# Patient Record
Sex: Male | Born: 1995 | Race: Black or African American | Hispanic: No | Marital: Single
Health system: Southern US, Community
[De-identification: ages and names within clinical notes are randomized; demographics above are authoritative.]

## PROBLEM LIST (undated history)

## (undated) DIAGNOSIS — L309 Dermatitis, unspecified: Secondary | ICD-10-CM

## (undated) HISTORY — DX: Dermatitis, unspecified: L30.9

---

## 2020-09-17 ENCOUNTER — Encounter (HOSPITAL_COMMUNITY): Payer: Self-pay

## 2020-09-17 ENCOUNTER — Emergency Department (HOSPITAL_COMMUNITY): Payer: Self-pay

## 2020-09-17 ENCOUNTER — Emergency Department (HOSPITAL_COMMUNITY)
Admission: EM | Admit: 2020-09-17 | Discharge: 2020-09-17 | Disposition: A | Discharge status: Home / Self Care | Payer: Self-pay | Attending: Emergency Medicine | Admitting: Emergency Medicine

## 2020-09-17 DIAGNOSIS — S0990XA Unspecified injury of head, initial encounter: Secondary | ICD-10-CM | POA: Insufficient documentation

## 2020-09-17 DIAGNOSIS — W2101XA Struck by football, initial encounter: Secondary | ICD-10-CM | POA: Insufficient documentation

## 2020-09-17 DIAGNOSIS — Y9362 Activity, american flag or touch football: Secondary | ICD-10-CM | POA: Insufficient documentation

## 2020-09-17 NOTE — ED Triage Notes (Signed)
Pt was playing flag football and collided with someone, hit head LOC, hematoma to back of head, bleeding controlled, denies other injuries.

## 2020-09-17 NOTE — ED Notes (Signed)
Transport to CT

## 2020-09-17 NOTE — Discharge Instructions (Addendum)
You were seen in the emergency department today following a head injury.  We suspect that you have a concussion, otherwise known and as a mild traumatic brain injury.  Your  CT scan did not show any findings requiring you to be admitted to the hospital.  1. Medications: Ibuprofen or Tylenol for pain 2. Treatment: Rest, ice on head.  Concussion precautions given - keep patient in a quiet, not simulating, dark environment. No TV, computer use, video games until headache is resolved completely. No contact sports until cleared by the primary care provider. 3. Follow Up: With primary care physician in 2-3 days if headache persists.  Return to the emergency department if patient becomes lethargic, begins vomiting , develops double vision, speech difficulty, problems walking or other change in mental status.  We would like you to follow-up with the [facility], contact information below:  Address: [address], [place], [place] [address] Phone: [phone]  Per [facility] Website:   What to Expect: Evaluations at the Concussion Clinic All patients at the Concussion Clinic are given an extensive three-part evaluation that includes: a computerized test to measure memory, visual processing speed, and reaction time a test that measures the systems that integrate movement, balance, and vision an in-depth review of a detailed symptoms checklist for signs of concussion The evaluation process is critical for the treatment and recovery of a concussed patient as no two concussions are alike. Thankfully, the diagnostic tools that trained professionals use can help to better manage head injuries.  Part of the technology the doctors and staff at [facility] use in their assessments is a computerized examination called ImPACT. This tool uses six tasks to measure memory, visual processing speed, and reaction time. By analyzing the results of the  examination and comparing them to average responses or a baseline score for a patient, our staff can make an informed judgment about the patient's cognitive functions. In addition to the ImPACT examination, patients are given a Vestibular Ocular Motor Screening test. This is a simple and painless test that focuses on the systems that integrate a patient's movement, balance, and vision.  These tests are used in conjunction with a thorough review of a detailed symptoms checklist to complete the patient's evaluation and develop a treatment plan.  You do not need a referral, and you can book an appointment online. Our Concussion Hotline is staffed by trained professionals during our regular office hours: Monday - Thursday from 7:30 AM to 4:30 PM, and Fridays from 7:30 AM to 12:00 PM, and the number to call is [phone]. The Concussion Clinic team meets with patients at our [facility] office. Call today.   Further ED Instructions:   Please call and follow-up within the concussion clinic as well as your primary care provider within the next 3 to 5 days.  In the meantime we would like you to avoid strenuous/over exertional activities such as sports or running.  Please avoid excess screen time utilizing cell phones, computers, or the TV.  Please avoid activities that require significant amount of concentration.  Please try to rest as much as possible.  Please take Tylenol and/or Motrin per over-the-counter dosing instructions for any continued discomfort.  Return to the ER for new or worsening symptoms or any other concerns that you may have.

## 2020-09-17 NOTE — ED Provider Notes (Signed)
[facility] EMERGENCY DEPARTMENT Provider Note   CSN: [id number] Arrival date & time: 09/17/20  2019     History Chief Complaint  Patient presents with  . Head Laceration    Douglas Norman is a 24 y.o. male with past medical history significant for eczema. Tetanus immunization is up to date.  HPI Patient presents to emergency department today with chief complaint of head laceration happening just prior to arrival.  Patient does not remember exactly what happened.  The last thing he remembers is he was playing flag football and he jumped to catch a ball. He woke up on the ground.  Patient friend is at the bedside.  He is actually the person that patient collided with.  Friend states that both jumped to knock a ball out of the air and collided.  Patient fell backwards and hit his head on the pavement.  Patient did not lose consciousness however was confused.  He was moving all extremities after the fall.  When he stood up to walk he was dizzy.  Patient is acting like his usual self per friend.  Patient is endorsing pain localized to wound on his head.  He rates the pain 2 out of 10 in severity.  He describes as a dull aching sensation.  No radiation of pain.  Bleeding is controlled with pressure.  He denies blurry vision, neck pain, numbness, tingling, weakness, nausea, emesis.    Past Medical History:  Diagnosis Date  . Eczema     There are no problems to display for this patient.   History reviewed. No pertinent surgical history.     No family history on file.  Social History   Tobacco Use  . Smoking status: Not on file  Substance Use Topics  . Alcohol use: Not on file  . Drug use: Not on file    Home Medications Prior to Admission medications   Not on File    Allergies    Patient has no known allergies.  Review of Systems   Review of Systems All other systems are reviewed and are negative for acute change except as noted in the HPI.  Physical  Exam Updated Vital Signs BP 124/60 (BP Location: Right Arm)   Pulse 71   Temp 98.5 F (36.9 C) (Oral)   Resp 16   Ht 5\' 8"  (1.727 m)   Wt 79.8 kg   SpO2 98%   BMI 26.76 kg/m   Physical Exam Vitals and nursing note reviewed.  Constitutional:      Appearance: He is well-developed. He is not ill-appearing or toxic-appearing.  HENT:     Head: Normocephalic. Laceration present. No raccoon eyes or Battle's sign.     Jaw: There is normal jaw occlusion.      Right Ear: No mastoid tenderness. No hemotympanum.     Left Ear: No mastoid tenderness. No hemotympanum.     Nose: Nose normal.     Right Nostril: No epistaxis.     Left Nostril: No epistaxis.     Mouth/Throat:     Mouth: No injury or lacerations.     Pharynx: Oropharynx is clear. Uvula midline.  Eyes:     General: No scleral icterus.       Right eye: No discharge.        Left eye: No discharge.     Conjunctiva/sclera: Conjunctivae normal.     Comments: No pain with EOMs  Neck:     Vascular: No JVD.  Comments: Full ROM intact without spinous process TTP. No bony stepoffs or deformities, no paraspinous muscle TTP or muscle spasms. No rigidity or meningeal signs. No bruising, erythema, or swelling.  Cardiovascular:     Rate and Rhythm: Normal rate and regular rhythm.     Pulses: Normal pulses.     Heart sounds: Normal heart sounds.  Pulmonary:     Effort: Pulmonary effort is normal.     Breath sounds: Normal breath sounds.  Abdominal:     General: There is no distension.  Musculoskeletal:        General: Normal range of motion.     Cervical back: Normal range of motion.  Skin:    General: Skin is warm and dry.  Neurological:     Mental Status: He is oriented to person, place, and time.     GCS: GCS eye subscore is 4. GCS verbal subscore is 5. GCS motor subscore is 6.     Comments: Fluent speech, no facial droop.  Speech is clear and goal oriented, follows commands CN III-XII intact, no facial droop Normal  strength in upper and lower extremities bilaterally including dorsiflexion and plantar flexion, strong and equal grip strength Sensation normal to light and sharp touch Moves extremities without ataxia, coordination intact Normal finger to nose and rapid alternating movements Normal gait and balance   Psychiatric:        Behavior: Behavior normal.     ED Results / Procedures / Treatments   Labs (all labs ordered are listed, but only abnormal results are displayed) Labs Reviewed - No data to display  EKG None  Radiology CT Head Wo Contrast  Result Date: 09/17/2020 CLINICAL DATA:  Bumped heads with brother while playing football, short loss of consciousness EXAM: CT HEAD WITHOUT CONTRAST TECHNIQUE: Contiguous axial images were obtained from the base of the skull through the vertex without intravenous contrast. COMPARISON:  None. FINDINGS: Brain: No evidence of acute infarction, hemorrhage, hydrocephalus, extra-axial collection, visible mass lesion or mass effect. Vascular: No hyperdense vessel or unexpected calcification. Skull: Left parietal scalp swelling and crescentic hematoma measuring up to 12 mm in maximal thickness. No subjacent calvarial fracture or acute osseous injury. No suspicious osseous lesions. Sinuses/Orbits: Paranasal sinuses and mastoid air cells are predominantly clear. Included orbital structures are unremarkable. Other: None IMPRESSION: 1. Left parietal scalp swelling and crescentic hematoma measuring up to 12 mm in maximal thickness. No subjacent calvarial fracture or acute osseous injury. 2. No acute intracranial abnormality. Electronically Signed   By: Kreg Shropshire M.D.   On: 09/17/2020 22:02    Procedures Procedures (including critical care time)  Medications Ordered in ED Medications - No data to display  ED Course  I have reviewed the triage vital signs and the nursing notes.  Pertinent labs & imaging results that were available during my care of the  patient were reviewed by me and considered in my medical decision making (see chart for details).    MDM Rules/Calculators/A&P                          History provided by patient and friend with additional history obtained from chart review.    Patient with head injury which did not cause of loss of consciousness but with persistent headache since the initial trauma.  No evidence of skull fracture on physical exam. Patient is not taking anticoagulants, is less than 65 and has no history of subarachnoid or subdural hemorrhage.  Patient denies nausea, vomiting, amnesia, vision changes,cognitive or memory dysfunction and vertigo.  Patient with no focal neurological deficits on physical exam.  Discussed thoroughly symptoms to return to the emergency department including severe headaches, disequilibrium, vomiting, double vision, extremity weakness, difficulty ambulating, or any other concerning symptoms.  Discussed the likely etiology of patient's symptoms being concussive in nature. CT head ordered based on hematoma and severity of injury with landing on pavement. CT shows no trauma on left parietal scalp measuring 12 mm in maximal thickness.  No adjacent fracture.  No acute intracranial abnormalities. Wound care performed.  Patient's wounds are superficial and there is nothing amenable to repair. Tetanus is UTD. Offered patient Tylenol for headache which he politely declined. Patient will be discharged with information pertaining to diagnosis and advised to use over-the-counter medications like NSAIDs and Tylenol for pain relief. Pt has also advised to not participate in contact sports until they are completely asymptomatic for at least 1 week or they are cleared by their doctor. Information given for concussion clinic for f/u.   Portions of this note were generated with Scientist, clinical (histocompatibility and immunogenetics). Dictation errors may occur despite best attempts at proofreading.      Final Clinical Impression(s) / ED  Diagnoses Final diagnoses:  Injury of head, initial encounter    Rx / DC Orders ED Discharge Orders    None       Kathyrn Lass 09/17/20 2300    Pollyann Savoy, MD 09/18/20 [id number]

## 2021-03-16 IMAGING — CT CT HEAD W/O CM
4 series · 15 of 47 positions shown, 17 images · non-contrast
Comparison: None.

CLINICAL DATA: Bumped heads with brother while playing football,
short loss of consciousness

EXAM:
CT HEAD WITHOUT CONTRAST
TECHNIQUE: Contiguous axial images were obtained from the base of the skull
through the vertex without intravenous contrast.

[Series 3: head without · axial · non-contrast · 0.46mm/px · z∈[+1555,+1690]mm · 7 of 37 slices shown, 9 images]
[im 5/37  brain]
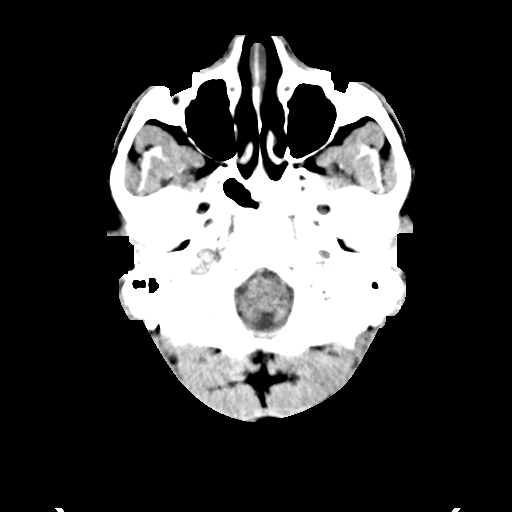
[im 5/37  bone]
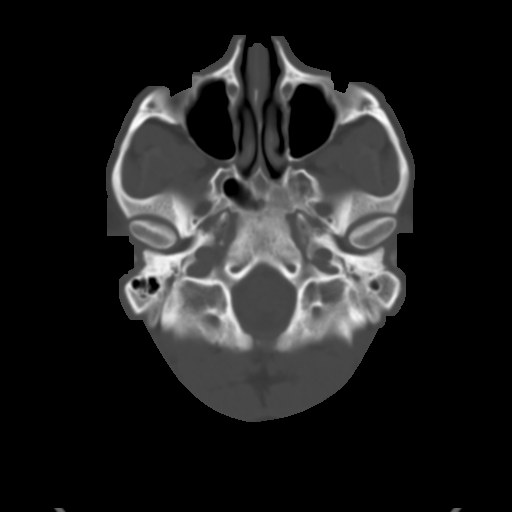
[im 10/37  brain]
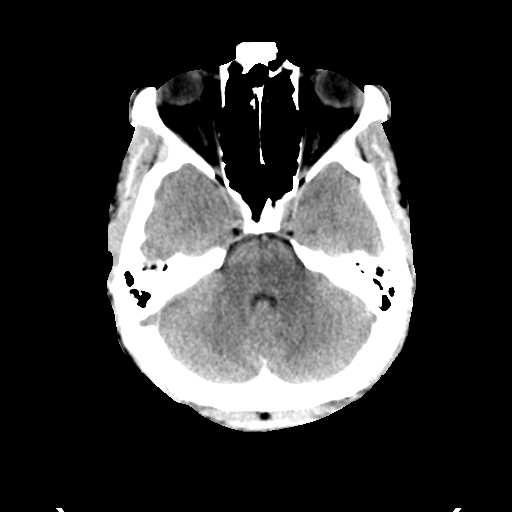
[im 14/37  brain]
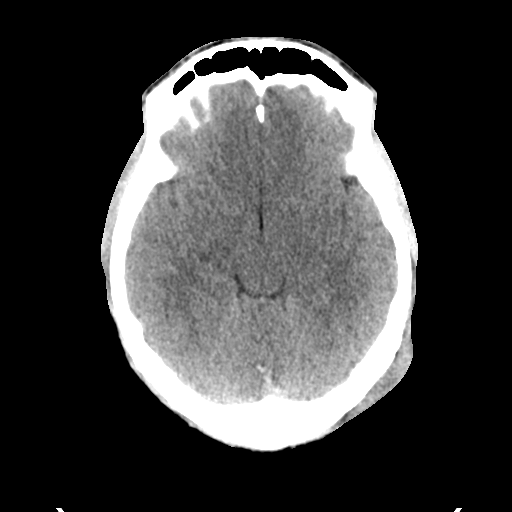
[im 19/37  brain]
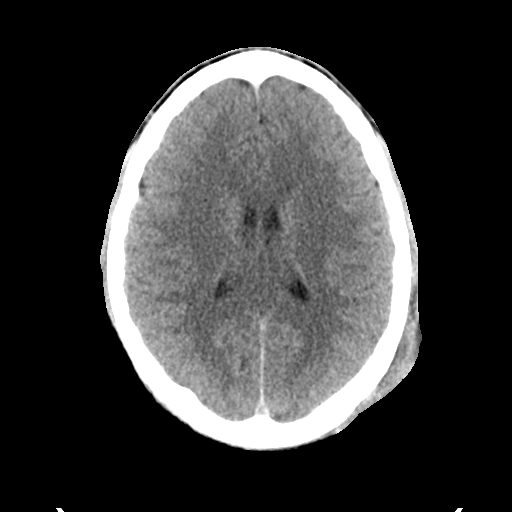
[im 23/37  brain]
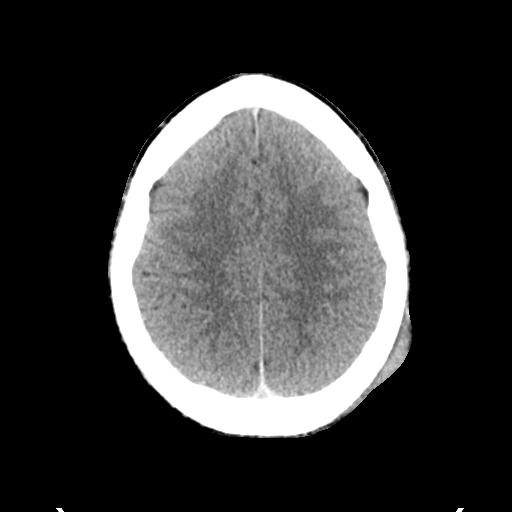
[im 23/37  bone]
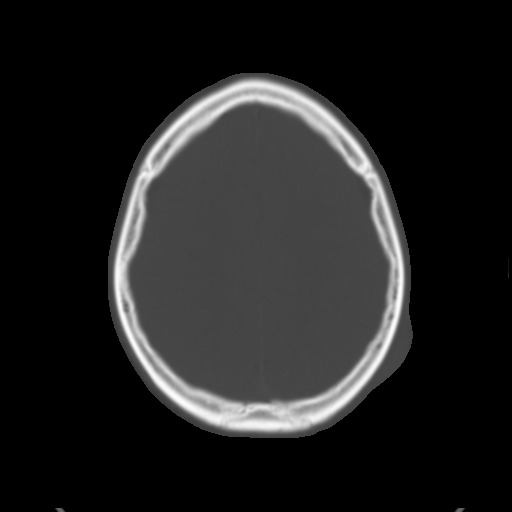
[im 28/37  brain]
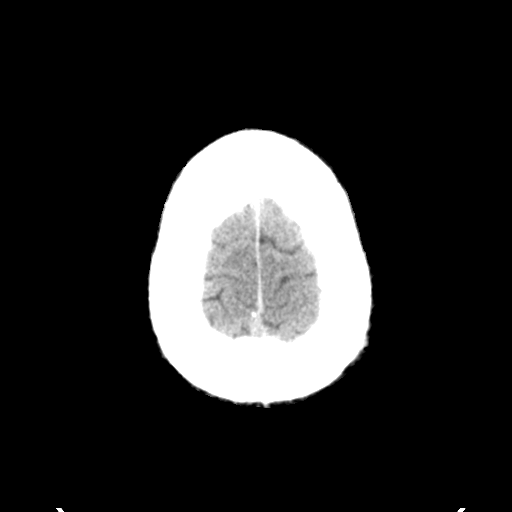
[im 32/37  brain]
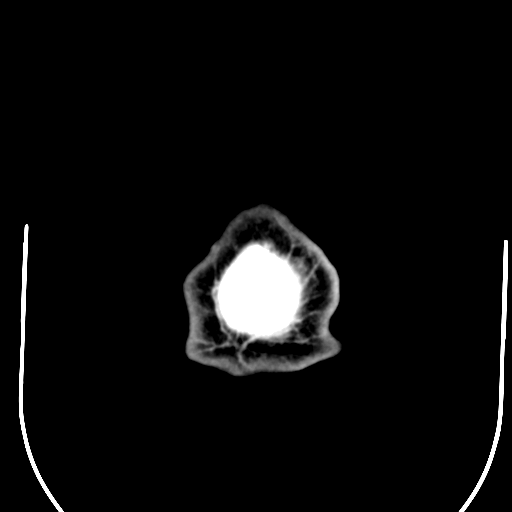

[Series 4: head bone · axial · 0.46mm/px · z∈[+1553,+1571]mm · 2 of 92 slices shown]
[im 10/92  bone]
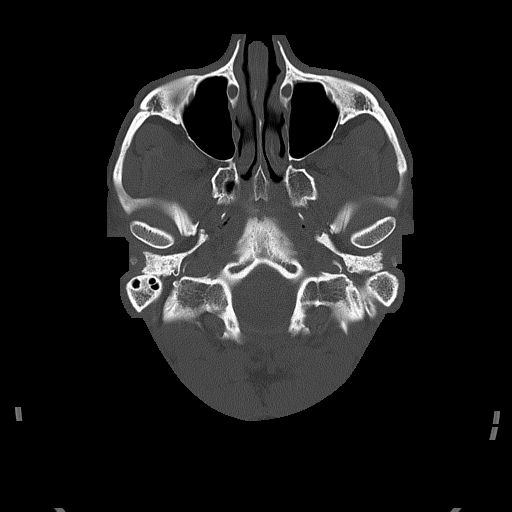
[im 19/92  bone]
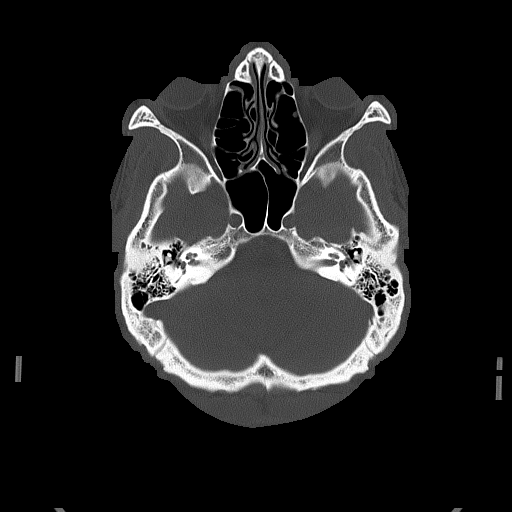

[Series 5: head without cor · coronal · non-contrast · 0.36mm/px · 3 of 67 slices shown]
[im 23/67  brain]
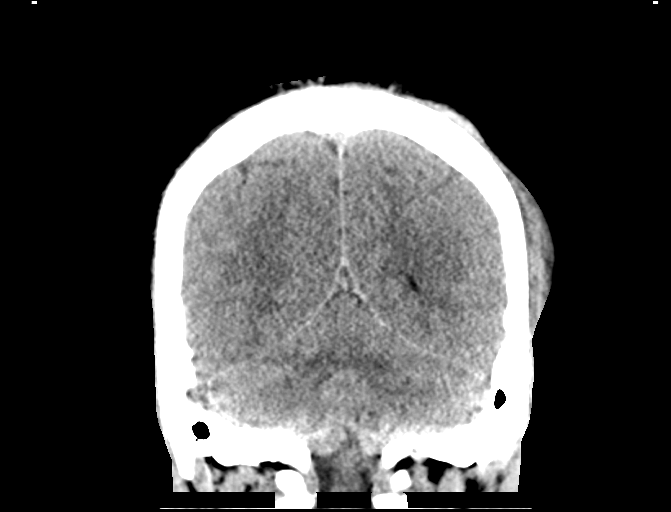
[im 30/67  brain]
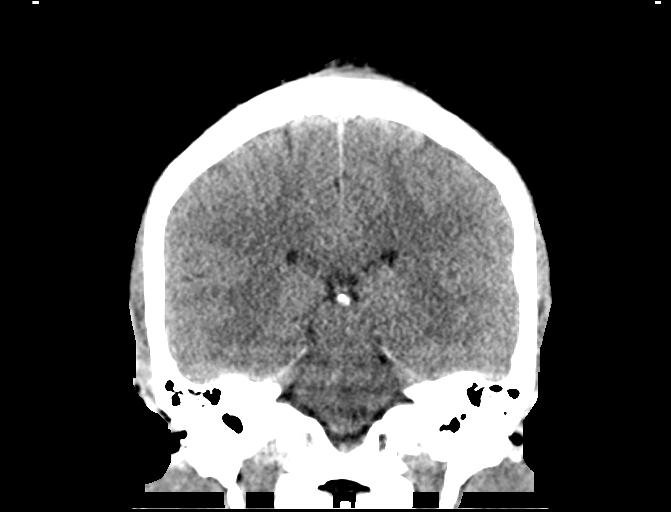
[im 37/67  brain]
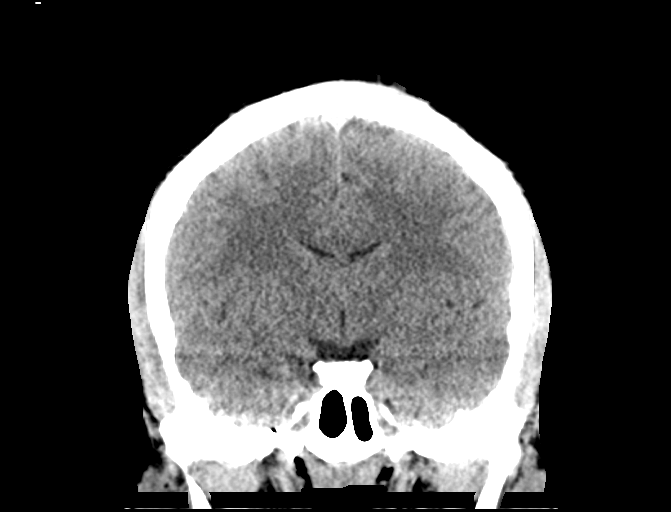

[Series 6: head without sag · sagittal · non-contrast · 0.36mm/px · 3 of 67 slices shown]
[im 23/67  brain]
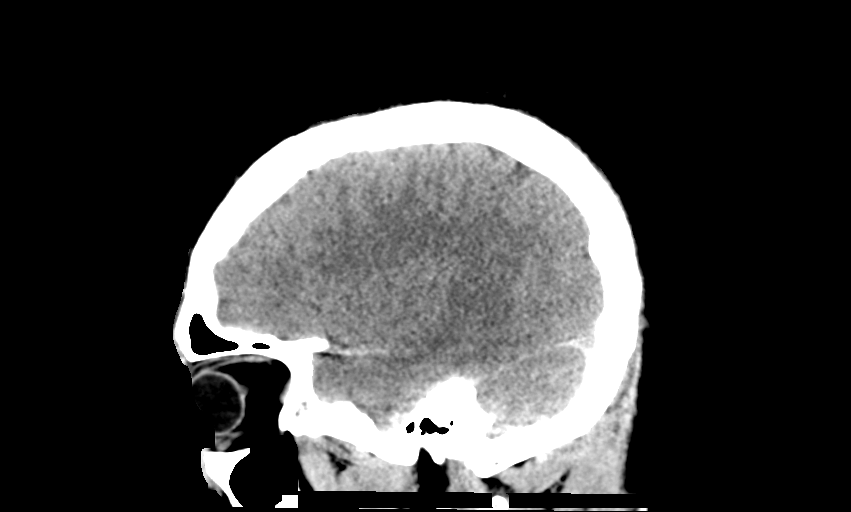
[im 34/67  brain]
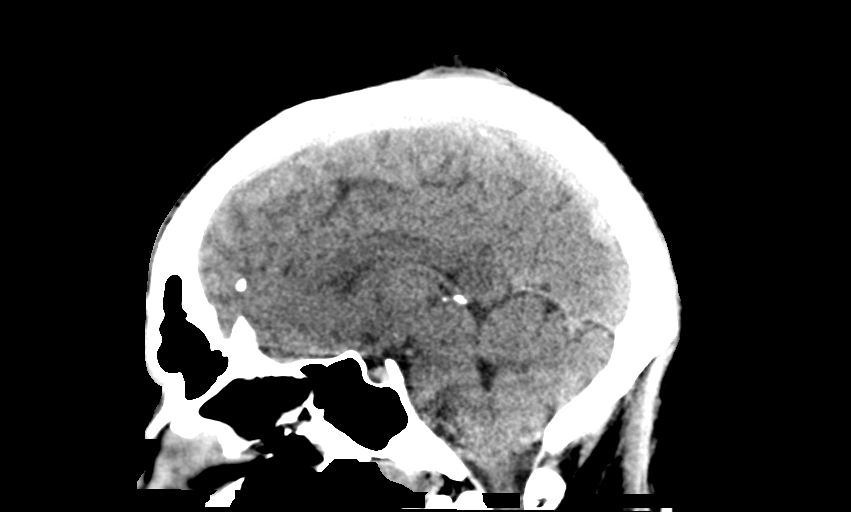
[im 45/67  brain]
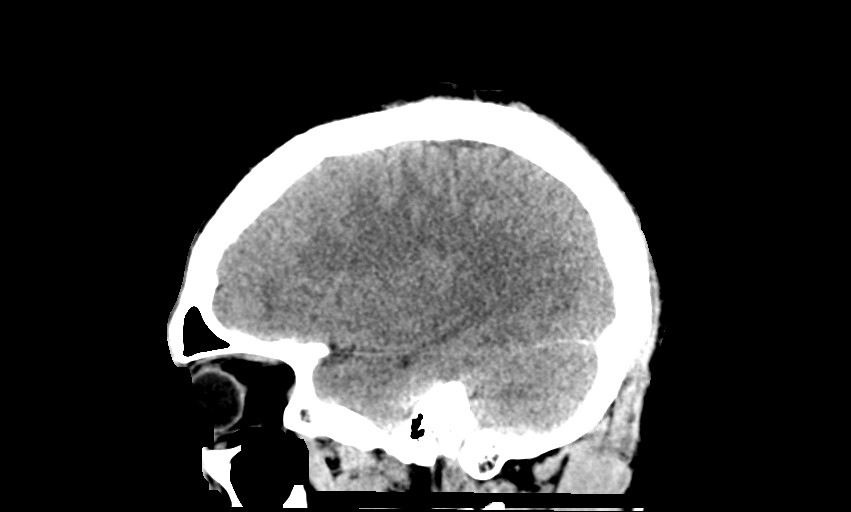

[15 of 47 positions shown; findings below may reference images not displayed]

FINDINGS: Brain: No evidence of acute infarction, hemorrhage, hydrocephalus,
extra-axial collection, visible mass lesion or mass effect.

Vascular: No hyperdense vessel or unexpected calcification.

Skull: Left parietal scalp swelling and crescentic hematoma
measuring up to 12 mm in maximal thickness. No subjacent calvarial
fracture or acute osseous injury. No suspicious osseous lesions.

Sinuses/Orbits: Paranasal sinuses and mastoid air cells are
predominantly clear. Included orbital structures are unremarkable.

Other: None
IMPRESSION: 1. Left parietal scalp swelling and crescentic hematoma measuring up
to 12 mm in maximal thickness. No subjacent calvarial fracture or
acute osseous injury.
2. No acute intracranial abnormality.
